# Patient Record
Sex: Male | Born: 1975 | Race: Black or African American | Hispanic: No | Marital: Single | Smoking: Never smoker
Health system: Southern US, Community
[De-identification: ages and names within clinical notes are randomized; demographics above are authoritative.]

---

## 2005-04-14 ENCOUNTER — Ambulatory Visit: Payer: Self-pay | Admitting: Internal Medicine

## 2005-05-05 ENCOUNTER — Encounter: Payer: Self-pay | Admitting: Cardiology

## 2005-05-05 ENCOUNTER — Ambulatory Visit: Payer: Self-pay

## 2005-05-13 ENCOUNTER — Ambulatory Visit: Payer: Self-pay | Admitting: Internal Medicine

## 2008-04-05 ENCOUNTER — Emergency Department (HOSPITAL_COMMUNITY): Admission: EM | Admit: 2008-04-05 | Discharge: 2008-04-06 | Payer: Self-pay | Admitting: Emergency Medicine

## 2012-07-14 ENCOUNTER — Ambulatory Visit (INDEPENDENT_AMBULATORY_CARE_PROVIDER_SITE_OTHER): Payer: BC Managed Care – PPO | Admitting: Emergency Medicine

## 2012-07-14 VITALS — BP 138/83 | HR 71 | Temp 98.3°F | Resp 16 | Ht 69.0 in | Wt 193.0 lb

## 2012-07-14 DIAGNOSIS — S335XXA Sprain of ligaments of lumbar spine, initial encounter: Secondary | ICD-10-CM

## 2012-07-14 MED ORDER — NAPROXEN SODIUM 550 MG PO TABS: 550.0000 mg | ORAL_TABLET | Freq: Two times a day (BID) | ORAL | Status: AC

## 2012-07-14 MED ORDER — CYCLOBENZAPRINE HCL 10 MG PO TABS: 10.0000 mg | ORAL_TABLET | Freq: Three times a day (TID) | ORAL | Status: AC | PRN

## 2012-07-14 NOTE — Patient Instructions (Addendum)

## 2012-07-14 NOTE — Progress Notes (Signed)
[facility] [address] [place] [address] [phone]  Date:  07/14/2012   Name:  Phillip Griffith   DOB:  28-Dec-1975   MRN:  [id number]  PCP:  No primary provider on file.    Chief Complaint: Back Pain   History of Present Illness:  Phillip Griffith is a 37 y.o. very pleasant male patient who presents with the following:  History of sciatic neuritis.  Has low back pain not associated with injury or overuse.  Has pain in right low back.  Non radiating.  No neuro symptoms.  No weakness.  No improvement with over the counter medications or other home remedies. Denies other complaint or health concern today. Works two full time jobs with a significant amount of lifting associated with each.   There are no active problems to display for this patient.   No past medical history on file.  No past surgical history on file.  History  Substance Use Topics  . Smoking status: Never Smoker   . Smokeless tobacco: Never Used  . Alcohol Use: Yes    No family history on file.  Allergies  Allergen Reactions  . Iodine     Medication list has been reviewed and updated.  No current outpatient prescriptions on file prior to visit.   No current facility-administered medications on file prior to visit.    Review of Systems:  As per HPI, otherwise negative.    Physical Examination: Filed Vitals:   07/14/12 1926  BP: 138/83  Pulse: 71  Temp: 98.3 F (36.8 C)  Resp: 16   Filed Vitals:   07/14/12 1926  Height: 5\' 9"  (1.753 m)  Weight: 193 lb (87.544 kg)   Body mass index is 28.49 kg/(m^2). Ideal Body Weight: Weight in (lb) to have BMI = 25: 168.9   GEN: WDWN, NAD, Non-toxic, Alert & Oriented x 3 HEENT: Atraumatic, Normocephalic.  Ears and Nose: No external deformity. EXTR: No clubbing/cyanosis/edema NEURO: Normal gait.  PSYCH: Normally interactive. Conversant. Not depressed or anxious appearing.  Calm demeanor.  BACK:  Tender right paraspinous  muscles.  Neuro grossly intact.  Assessment and Plan: Lumbar strain Anaprox  Flexeril   Signed,  Phillips Odor, MD

## 2019-06-22 ENCOUNTER — Emergency Department (HOSPITAL_COMMUNITY): Payer: 59

## 2019-06-22 ENCOUNTER — Emergency Department (HOSPITAL_COMMUNITY)
Admission: EM | Admit: 2019-06-22 | Discharge: 2019-06-22 | Disposition: A | Discharge status: Home / Self Care | Payer: 59 | Attending: Emergency Medicine | Admitting: Emergency Medicine

## 2019-06-22 ENCOUNTER — Other Ambulatory Visit: Payer: Self-pay

## 2019-06-22 DIAGNOSIS — R350 Frequency of micturition: Secondary | ICD-10-CM | POA: Insufficient documentation

## 2019-06-22 DIAGNOSIS — R569 Unspecified convulsions: Secondary | ICD-10-CM | POA: Diagnosis not present

## 2019-06-22 DIAGNOSIS — F1290 Cannabis use, unspecified, uncomplicated: Secondary | ICD-10-CM | POA: Insufficient documentation

## 2019-06-22 DIAGNOSIS — Z79899 Other long term (current) drug therapy: Secondary | ICD-10-CM | POA: Diagnosis not present

## 2019-06-22 DIAGNOSIS — H539 Unspecified visual disturbance: Secondary | ICD-10-CM | POA: Diagnosis not present

## 2019-06-22 LAB — CBC WITH DIFFERENTIAL/PLATELET
Abs Immature Granulocytes: 0.04 10*3/uL (ref 0.00–0.07)
Basophils Absolute: 0 10*3/uL (ref 0.0–0.1)
Basophils Relative: 0 %
Eosinophils Absolute: 0 10*3/uL (ref 0.0–0.5)
Eosinophils Relative: 0 %
HCT: 43.4 % (ref 39.0–52.0)
Hemoglobin: 14.5 g/dL (ref 13.0–17.0)
Immature Granulocytes: 0 %
Lymphocytes Relative: 13 %
Lymphs Abs: 1.2 10*3/uL (ref 0.7–4.0)
MCH: 28.9 pg (ref 26.0–34.0)
MCHC: 33.4 g/dL (ref 30.0–36.0)
MCV: 86.5 fL (ref 80.0–100.0)
Monocytes Absolute: 0.7 10*3/uL (ref 0.1–1.0)
Monocytes Relative: 7 %
Neutro Abs: 7.1 10*3/uL (ref 1.7–7.7)
Neutrophils Relative %: 80 %
Platelets: UNDETERMINED 10*3/uL (ref 150–400)
RBC: 5.02 MIL/uL (ref 4.22–5.81)
RDW: 12.1 % (ref 11.5–15.5)
WBC: 9.1 10*3/uL (ref 4.0–10.5)
nRBC: 0 % (ref 0.0–0.2)

## 2019-06-22 LAB — BASIC METABOLIC PANEL
Anion gap: 15 (ref 5–15)
BUN: 14 mg/dL (ref 6–20)
CO2: 21 mmol/L — ABNORMAL LOW (ref 22–32)
Calcium: 9 mg/dL (ref 8.9–10.3)
Chloride: 106 mmol/L (ref 98–111)
Creatinine, Ser: 1.55 mg/dL — ABNORMAL HIGH (ref 0.61–1.24)
GFR calc Af Amer: >60 mL/min (ref >60)
GFR calc non Af Amer: 54 mL/min — ABNORMAL LOW (ref >60)
Glucose, Bld: 118 mg/dL — ABNORMAL HIGH (ref 70–99)
Potassium: 3.5 mmol/L (ref 3.5–5.1)
Sodium: 142 mmol/L (ref 135–145)

## 2019-06-22 LAB — CBC
HCT: 42.7 % (ref 39.0–52.0)
Hemoglobin: 13.8 g/dL (ref 13.0–17.0)
MCH: 28.3 pg (ref 26.0–34.0)
MCHC: 32.3 g/dL (ref 30.0–36.0)
MCV: 87.7 fL (ref 80.0–100.0)
Platelets: 187 10*3/uL (ref 150–400)
RBC: 4.87 MIL/uL (ref 4.22–5.81)
RDW: 12.1 % (ref 11.5–15.5)
WBC: 5.3 10*3/uL (ref 4.0–10.5)
nRBC: 0 % (ref 0.0–0.2)

## 2019-06-22 LAB — TROPONIN I (HIGH SENSITIVITY): Troponin I (High Sensitivity): <2 ng/L (ref <18)

## 2019-06-22 LAB — RAPID URINE DRUG SCREEN, HOSP PERFORMED
Amphetamines: NOT DETECTED
Barbiturates: NOT DETECTED
Benzodiazepines: NOT DETECTED
Cocaine: NOT DETECTED
Opiates: NOT DETECTED
Tetrahydrocannabinol: POSITIVE — AB

## 2019-06-22 LAB — CBG MONITORING, ED: Glucose-Capillary: 92 mg/dL (ref 70–99)

## 2019-06-22 MED ORDER — SODIUM CHLORIDE 0.9 % IV BOLUS
1000.0000 mL | Freq: Once | INTRAVENOUS | Status: AC
  Administered 2019-06-22: 1000 mL via INTRAVENOUS

## 2019-06-22 NOTE — ED Notes (Signed)
Pt was discharged from the ED. Pt read and understood discharge paperwork. Pt had vital signs completed. Pt conscious, breathing, and A&Ox4. No distress noted. Pt speaking in complete sentences. Pt ambulated out of the ED with a smooth and steady gait.  

## 2019-06-22 NOTE — ED Triage Notes (Signed)
To ED for eval of possible seizure. Pt was aware of episode. Family describes pt as posturing and fell from the chair. Pt states he was really hot and became dizzy. Doesn't sound like he had postictal state. States he feels tired now. No incontinence. Alert and oriented now. With 18g in LAC. 500 cc NS given PTA by EMS

## 2019-06-22 NOTE — ED Provider Notes (Addendum)
[facility] EMERGENCY DEPARTMENT Provider Note   CSN: [id number] Arrival date & time: 06/22/19  1723     History Chief Complaint  Patient presents with   Seizures    Phillip Griffith is a 44 y.o. male.  HPI 44 year old male with a history of hypertension, not on medication presents to the ER after the outside of this family, son 10, where he suddenly started to feel lightheaded and dizzy, some blurry vision, and he laid himself down in the grass.  He does not remember what happened for the next few minutes, but came to the states that he was slightly confused.  He denies hitting his head, falling, states that this happens to him sometime before but just not to this extent.  He denies seizure-like activity, chest pain, shortness of breath, abdominal pain, nausea, vomiting, diarrhea.  He refers he has episodes like this before when he does not drink enough water.  He does note that he has been urinating more frequently as of late.  Endorses drinking alcohol and smoking marijuana earlier today with his family.  Denies chronic alcohol use.  He does not have a history of seizures, ACS, strokes.  Not on blood thinners.  No past medical history on file.  There are no problems to display for this patient.    No family history on file.  Social History   Tobacco Use   Smoking status: Never Smoker   Smokeless tobacco: Never Used  Substance Use Topics   Alcohol use: Yes   Drug use: No    Home Medications Prior to Admission medications   Medication Sig Start Date End Date Taking? Authorizing Provider  Black Currant Seed Oil 500 MG CAPS Take 1 capsule by mouth daily.   Yes [provider]  VITAMIN D PO Take 1 tablet by mouth daily.   Yes [provider]  cyclobenzaprine (FLEXERIL) 10 MG tablet Take 1 tablet (10 mg total) by mouth 3 (three) times daily as needed for muscle spasms. Patient not taking: Reported on 06/22/2019 07/14/12   Roselee Culver,  MD    Allergies    Iodine  Review of Systems   Review of Systems  Constitutional: Negative for chills, fatigue and fever.  Eyes: Positive for visual disturbance.  Respiratory: Negative for cough, chest tightness and shortness of breath.   Cardiovascular: Negative for chest pain and palpitations.  Gastrointestinal: Negative for abdominal pain, diarrhea, nausea and vomiting.  Genitourinary: Positive for frequency. Negative for dysuria and hematuria.  Musculoskeletal: Negative for arthralgias, back pain and neck pain.  Neurological: Positive for dizziness, syncope, weakness and light-headedness. Negative for seizures, speech difficulty and headaches.  Psychiatric/Behavioral: Positive for confusion.    Physical Exam Updated Vital Signs BP 95/69    Pulse 71    Temp 98 F (36.7 C) (Oral)    Resp 16    SpO2 100%   Physical Exam Constitutional:      General: He is not in acute distress.    Appearance: Normal appearance. He is not ill-appearing, toxic-appearing or diaphoretic.  HENT:     Head: Normocephalic and atraumatic.  Eyes:     Extraocular Movements: Extraocular movements intact.     Pupils: Pupils are equal, round, and reactive to light.  Cardiovascular:     Rate and Rhythm: Normal rate and regular rhythm.     Pulses: Normal pulses.     Heart sounds: Normal heart sounds.  Pulmonary:     Effort: Pulmonary effort is normal.  Breath sounds: Normal breath sounds.  Abdominal:     General: Abdomen is flat.     Palpations: Abdomen is soft.  Musculoskeletal:        General: No swelling or tenderness.     Cervical back: Normal range of motion. No rigidity or tenderness.  Skin:    General: Skin is warm and dry.  Neurological:     General: No focal deficit present.     Mental Status: He is alert and oriented to person, place, and time.     Cranial Nerves: No cranial nerve deficit.     Sensory: No sensory deficit.     Motor: No weakness.     Coordination: Coordination  normal.  Psychiatric:        Mood and Affect: Mood normal.        Behavior: Behavior normal.     ED Results / Procedures / Treatments   Labs (all labs ordered are listed, but only abnormal results are displayed) Labs Reviewed  BASIC METABOLIC PANEL - Abnormal; Notable for the following components:      Result Value   CO2 21 (*)    Glucose, Bld 118 (*)    Creatinine, Ser 1.55 (*)    GFR calc non Af Amer 54 (*)    All other components within normal limits  CBC  CBC WITH DIFFERENTIAL/PLATELET  RAPID URINE DRUG SCREEN, HOSP PERFORMED  ETHANOL  CBG MONITORING, ED  TROPONIN I (HIGH SENSITIVITY)  TROPONIN I (HIGH SENSITIVITY)    EKG EKG Interpretation  Date/Time:  Thursday June 22 2019 17:22:50 EDT Ventricular Rate:  70 PR Interval:  174 QRS Duration: 92 QT Interval:  408 QTC Calculation: 440 R Axis:   78 Text Interpretation: Normal sinus rhythm Minimal voltage criteria for LVH, may be normal variant ( Sokolow-Lyon ) Nonspecific ST and T wave abnormality Abnormal ECG No STEMI Confirmed by Alona Bene [phone]) on 06/22/2019 8:09:04 PM   Radiology CT Head Wo Contrast  Result Date: 06/22/2019 CLINICAL DATA:  44 year old male with seizures. Nontraumatic. EXAM: CT HEAD WITHOUT CONTRAST TECHNIQUE: Contiguous axial images were obtained from the base of the skull through the vertex without intravenous contrast. COMPARISON:  None. FINDINGS: Brain: The ventricles and sulci appropriate size for patient's age. The gray-white matter discrimination is preserved. There is no acute intracranial hemorrhage. No mass effect or midline shift. No extra-axial fluid collection. A 2.5 x 1.0 cm calcification along the left falx versus less likely a calcified meningioma. Vascular: No hyperdense vessel or unexpected calcification. Skull: Normal. Negative for fracture or focal lesion. Sinuses/Orbits: Minimal mucoperiosteal thickening of paranasal sinuses. No air-fluid level. The mastoid air cells are clear.  Cerumen noted in the external auditory canal bilaterally. Other: None IMPRESSION: No acute intracranial pathology. Electronically Signed   By: Elgie Collard M.D.   On: 06/22/2019 21:21   DG Chest Portable 1 View  Result Date: 06/22/2019 CLINICAL DATA:  Status post seizure. EXAM: PORTABLE CHEST 1 VIEW COMPARISON:  None. FINDINGS: The heart size and mediastinal contours are within normal limits. Both lungs are clear. The visualized skeletal structures are unremarkable. IMPRESSION: No active disease. Electronically Signed   By: Aram Candela M.D.   On: 06/22/2019 20:59    Procedures Procedures (including critical care time)  Medications Ordered in ED Medications  sodium chloride 0.9 % bolus 1,000 mL (0 mLs Intravenous Stopped 06/22/19 1955)    ED Course  I have reviewed the triage vital signs and the nursing notes.  Pertinent labs &  imaging results that were available during my care of the patient were reviewed by me and considered in my medical decision making (see chart for details).    MDM Rules/Calculators/A&P                     44 year old male without a significant medical history presents for possible new onset seizures. On presentation to the ER, patient is alert and oriented, no acute distress, reports that he is tired but currently asymptomatic.  Vitals nonconcerning.  Afebrile, no neck stiffness.  Physical exam with no pertinent positive findings.  No signs of tongue biting, or urination on self.  He is alert and oriented x4.  See HPI for further descriptions of his symptoms.  Patient reports he does not think he has a seizure, but is unaware of what exactly happened.  EKG with minimal voltage criteria for LVH and none specific ST and T wave changes.  Given this, will order delta troponin.   Upon speaking to the patient's family member over the phone, they described that he was sitting on a chair, started to say some weird words, and then laid himself down, clenched his fists  with some vibration of his upper and lower extremities with his eyes rolled behind his head.  She states this episode lasted approximate minute or 2, after which he came to slowly, but was confused.  Given this history, concern for new onset seizures is higher.  Will order CT of the head, UDS and alcohol level.  If negative, will send ambulatory referral to neurology for further follow-up.  Patient given 1 L fluid bolus in the ER, on top of the 500 cc bolus given by EMS.  Patient denies any nausea, vomiting.  Reports improvement of symptoms since presentation to the ER.  CT head negative.  Initial troponin less than 2.  Suspicion for ACS low.  Chest x-ray negative.  UDS and ethanol pending, but will not likely change ED course.   Unclear etiology of seizures likely seizure.  No repeat seizures in the ER, patient is alert and oriented, feeling well.  Ambulatory referral to neurology with close follow-up. Stressed to patient that he absolutely cannot drive in the state of [place] for 6 months until he is cleared by neurologist.  Return precautions given which included repeat seizures, increasing somnolence, nausea, vomiting.  Patient was seen and evaluated by Dr. Jacqulyn Bath is agreeable to the above plan.  Final Clinical Impression(s) / ED Diagnoses Final diagnoses:  Seizure-like activity (HCC)    Rx / DC Orders ED Discharge Orders         Ordered    Ambulatory referral to Neurology    Comments: An appointment is requested in approximately: 1 week with new onset seizure   06/22/19 2103           Mare Ferrari, PA-C 06/22/19 2307    Mare Ferrari, PA-C 06/22/19 2313    Maia Plan, MD 06/23/19 [phone]

## 2019-06-22 NOTE — Discharge Instructions (Addendum)
You have been seen in the emergency department today for a likely seizure.  Your workup today including labs are within normal limits.  Please follow up with your doctor as soon as possible regarding today's emergency department visit and your likely seizure.  You will also need to follow up with a neurologist as soon as possible, please call for appointment.  If you have been prescribed a medication for your seizures, please take this medication as prescribed.  As we have discussed it is very important that you DO NOT drive until you have been seen and cleared by your neurologist.  Please drink plenty of fluids, get plenty of sleep and avoid any alcohol or drug use.  Follow-up with your primary care provider or if you cannot afford a family doctor please visit [facility] to establish with them.  Return to the emergency department if you have any further seizures, develop any weakness/numbness of any arm/leg, confusion, slurred speech, or sudden/severe headache.

## 2019-07-05 ENCOUNTER — Other Ambulatory Visit: Payer: Self-pay

## 2019-07-05 ENCOUNTER — Ambulatory Visit: Payer: 59 | Admitting: Diagnostic Neuroimaging

## 2019-07-05 ENCOUNTER — Encounter: Payer: Self-pay | Admitting: Diagnostic Neuroimaging

## 2019-07-05 VITALS — BP 155/96 | HR 83 | Temp 98.2°F | Ht 69.0 in | Wt 187.8 lb

## 2019-07-05 DIAGNOSIS — G43109 Migraine with aura, not intractable, without status migrainosus: Secondary | ICD-10-CM

## 2019-07-05 DIAGNOSIS — R569 Unspecified convulsions: Secondary | ICD-10-CM

## 2019-07-05 NOTE — Progress Notes (Signed)
[facility]  PATIENT: Phillip Griffith DOB: 27-Jun-1975  REFERRING CLINICIAN: Long, Arlyss Repress, MD HISTORY FROM: patient  REASON FOR VISIT: new consult    HISTORICAL  CHIEF COMPLAINT:  Chief Complaint  Patient presents with  . Seizures    Rm 7 New Pt, mother- Rachael Darby "possible seizure on 06/22/19"     HISTORY OF PRESENT ILLNESS:   44 year old male 44 year old male here for evaluation of seizure or syncope.  06/22/2019 patient was at home with family, when he felt overheated and nauseated.  He sat down to rest.  The next thing he knows he woke up with people around him.  Apparently he had passed out, stiffened up, eyes rolled back.  Episode lasted for about 1 to 2 minutes.  Afterwards he was snoring for a while and then woke up.  He continued to feel drained of energy and tired.  EMS called and patient arrived to the emergency room.  CT scan and lab testing obtained.  Patient was diagnosed with possible new onset seizure versus syncope.  Since that time patient continues to feel tired and drained of energy.  He has been having more headaches in the last 3 months with migraine features, photophobia, nausea, occurring on a daily basis.  He averages about 5 hours of sleep per night has been under more stress lately.  No prior similar events.   REVIEW OF SYSTEMS: Full 14 system review of systems performed and negative with exception of: As per HPI.  ALLERGIES: Allergies  Allergen Reactions  . Iodine Rash    HOME MEDICATIONS: Outpatient Medications Prior to Visit  Medication Sig Dispense Refill  . Black Currant Seed Oil 500 MG CAPS Take 1 capsule by mouth daily.    Marland Kitchen VITAMIN D PO Take 1 tablet by mouth daily.    . cyclobenzaprine (FLEXERIL) 10 MG tablet Take 1 tablet (10 mg total) by mouth 3 (three) times daily as needed for muscle spasms. (Patient not taking: Reported on 06/22/2019) 30 tablet 0   No facility-administered medications prior to visit.    PAST  MEDICAL HISTORY: No past medical history on file.  PAST SURGICAL HISTORY: History reviewed. No pertinent surgical history.  FAMILY HISTORY: Family History  Problem Relation Age of Onset  . Hypertension Father     SOCIAL HISTORY: Social History   Socioeconomic History  . Marital status: Single    Spouse name: Not on file  . Number of children: Not on file  . Years of education: Not on file  . Highest education level: GED or equivalent  Occupational History  . Not on file  Tobacco Use  . Smoking status: Never Smoker  . Smokeless tobacco: Never Used  Substance and Sexual Activity  . Alcohol use: Yes    Comment: 07/05/19 maybe 1-2 x month  . Drug use: No    Comment: 07/05/19 denies  . Sexual activity: Yes  Other Topics Concern  . Not on file  Social History Narrative  . Not on file   Social Determinants of Health   Financial Resource Strain:   . Difficulty of Paying Living Expenses:   Food Insecurity:   . Worried About Programme researcher, broadcasting/film/video in the Last Year:   . Barista in the Last Year:   Transportation Needs:   . Freight forwarder (Medical):   Marland Kitchen Lack of Transportation (Non-Medical):   Physical Activity:   . Days of Exercise per Week:   . Minutes of Exercise per Session:  Stress:   . Feeling of Stress :   Social Connections:   . Frequency of Communication with Friends and Family:   . Frequency of Social Gatherings with Friends and Family:   . Attends Religious Services:   . Active Member of Clubs or Organizations:   . Attends Archivist Meetings:   Marland Kitchen Marital Status:   Intimate Partner Violence:   . Fear of Current or Ex-Partner:   . Emotionally Abused:   Marland Kitchen Physically Abused:   . Sexually Abused:      PHYSICAL EXAM  GENERAL EXAM/CONSTITUTIONAL: Vitals:  Vitals:   07/05/19 1118 07/05/19 1122  BP: (!) 156/90 (!) 155/96  Pulse: 83 83  Temp: 98.2 F (36.8 C)   Weight: 187 lb 12.8 oz (85.2 kg)   Height: 5\' 9"  (1.753 m)       Body mass index is 27.73 kg/m. Wt Readings from Last 3 Encounters:  07/05/19 187 lb 12.8 oz (85.2 kg)  07/14/12 193 lb (87.5 kg)     Patient is in no distress; well developed, nourished and groomed; neck is supple  CARDIOVASCULAR:  Examination of carotid arteries is normal; no carotid bruits  Regular rate and rhythm, no murmurs  Examination of peripheral vascular system by observation and palpation is normal  EYES:  Ophthalmoscopic exam of optic discs and posterior segments is normal; no papilledema or hemorrhages  No exam data present  MUSCULOSKELETAL:  Gait, strength, tone, movements noted in Neurologic exam below  NEUROLOGIC: MENTAL STATUS:  No flowsheet data found.  awake, alert, oriented to person, place and time  recent and remote memory intact  normal attention and concentration  language fluent, comprehension intact, naming intact  fund of knowledge appropriate  CRANIAL NERVE:   2nd - no papilledema on fundoscopic exam  2nd, 3rd, 4th, 6th - pupils equal and reactive to light, visual fields full to confrontation, extraocular muscles intact, no nystagmus  5th - facial sensation symmetric  7th - facial strength symmetric  8th - hearing intact  9th - palate elevates symmetrically, uvula midline  11th - shoulder shrug symmetric  12th - tongue protrusion midline  MOTOR:   normal bulk and tone, full strength in the BUE, BLE  SENSORY:   normal and symmetric to light touch, pinprick, temperature, vibration  COORDINATION:   finger-nose-finger, fine finger movements normal  REFLEXES:   deep tendon reflexes present and symmetric  GAIT/STATION:   narrow based gait     DIAGNOSTIC DATA (LABS, IMAGING, TESTING) - I reviewed patient records, labs, notes, testing and imaging myself where available.  Lab Results  Component Value Date   WBC 9.1 06/22/2019   HGB 14.5 06/22/2019   HCT 43.4 06/22/2019   MCV 86.5 06/22/2019   PLT  PLATELET CLUMPS NOTED ON SMEAR, UNABLE TO ESTIMATE 06/22/2019      Component Value Date/Time   NA 142 06/22/2019 1745   K 3.5 06/22/2019 1745   CL 106 06/22/2019 1745   CO2 21 (L) 06/22/2019 1745   GLUCOSE 118 (H) 06/22/2019 1745   BUN 14 06/22/2019 1745   CREATININE 1.55 (H) 06/22/2019 1745   CALCIUM 9.0 06/22/2019 1745   GFRNONAA 54 (L) 06/22/2019 1745   GFRAA >60 06/22/2019 1745   No results found for: CHOL, HDL, LDLCALC, LDLDIRECT, TRIG, CHOLHDL No results found for: HGBA1C No results found for: VITAMINB12 No results found for: TSH   06/22/19 CT head [I reviewed images myself and agree with interpretation. -VRP]  - negative  ASSESSMENT AND PLAN  44 y.o. year old male here with:  Dx:  1. New onset seizure (HCC)   2. Migraine with aura and without status migrainosus, not intractable       PLAN:  NEW ONSET SEIZURE vs SYNCOPE  - check MRI brain, EEG   - establish with PCP (follow up BP and kidney function and syncope workup)  - According to [place] law, you can not drive unless you are seizure / syncope free for at least 6 months and under physician's care.   - Please maintain precautions. Do not participate in activities where a loss of awareness could harm you or someone else. No swimming alone, no tub bathing, no hot tubs, no driving, no operating motorized vehicles (cars, ATVs, motocycles, etc), lawnmowers, power tools or firearms. No standing at heights, such as rooftops, ladders or stairs. Avoid hot objects such as stoves, heaters, open fires. Wear a helmet when riding a bicycle, scooter, skateboard, etc. and avoid areas of traffic. Set your water heater to 120 degrees or less.   MIGRAINE PREVENTION  LIFESTYLE CHANGES -Stop or avoid smoking -Decrease or avoid caffeine / alcohol -Eat and sleep on a regular schedule -Exercise several times per week - consider topiramate 50mg  at bedtime; after 1-2 weeks increase to 50mg  twice a day; drink plenty of  water  MIGRAINE RESCUE  - ibuprofen, tylenol as needed - consider rizatriptan (Maxalt) 10mg  as needed for breakthrough headache; may repeat x 1 after 2 hours; max 2 tabs per day or 8 per month  Orders Placed This Encounter  Procedures  . MR BRAIN W WO CONTRAST  . EEG adult   Return in about 6 months (around 01/04/2020).    , MD 07/05/2019, 11:32 AM Certified in Neurology, Neurophysiology and Neuroimaging  [facility] [address], [address] [place], [place] [address] [phone]

## 2019-07-05 NOTE — Patient Instructions (Signed)
NEW ONSET SEIZURE vs SYNCOPE  - check MRI brain, EEG   - establish with PCP (follow up BP and kidney function and syncope workup)  - According to [place] law, you can not drive unless you are seizure / syncope free for at least 6 months and under physician's care.   - Please maintain precautions. Do not participate in activities where a loss of awareness could harm you or someone else. No swimming alone, no tub bathing, no hot tubs, no driving, no operating motorized vehicles (cars, ATVs, motocycles, etc), lawnmowers, power tools or firearms. No standing at heights, such as rooftops, ladders or stairs. Avoid hot objects such as stoves, heaters, open fires. Wear a helmet when riding a bicycle, scooter, skateboard, etc. and avoid areas of traffic. Set your water heater to 120 degrees or less.   MIGRAINE PREVENTION  LIFESTYLE CHANGES -Stop or avoid smoking -Decrease or avoid caffeine / alcohol -Eat and sleep on a regular schedule -Exercise several times per week - consider topiramate 50mg  at bedtime; after 1-2 weeks increase to 50mg  twice a day; drink plenty of water  MIGRAINE RESCUE  - ibuprofen, tylenol as needed - consider rizatriptan (Maxalt) 10mg  as needed for breakthrough headache; may repeat x 1 after 2 hours; max 2 tabs per day or 8 per month

## 2019-07-10 ENCOUNTER — Telehealth: Payer: Self-pay | Admitting: Diagnostic Neuroimaging

## 2019-07-10 NOTE — Telephone Encounter (Signed)
no to the covid questions MR Brain w/wo contrast Dr. Marjory Lies [facility] Auth: [id number] (exp. 07/07/19 to 08/21/19). Patient is scheduled at [facility] for 08/09/19.

## 2019-08-03 ENCOUNTER — Other Ambulatory Visit: Payer: Self-pay

## 2019-08-03 ENCOUNTER — Ambulatory Visit: Payer: 59 | Admitting: Diagnostic Neuroimaging

## 2019-08-03 DIAGNOSIS — R569 Unspecified convulsions: Secondary | ICD-10-CM | POA: Diagnosis not present

## 2019-08-09 ENCOUNTER — Other Ambulatory Visit: Payer: 59

## 2019-08-11 MED ORDER — TOPIRAMATE 50 MG PO TABS: 50.0000 mg | ORAL_TABLET | Freq: Two times a day (BID) | ORAL | 12 refills | Status: AC

## 2019-08-11 NOTE — Procedures (Signed)
   [facility]  EEG (ELECTROENCEPHALOGRAM) REPORT   STUDY DATE: 08/03/19 PATIENT NAME: Phillip Griffith DOB: 1975-06-09 MRN: [id number]  ORDERING CLINICIAN: Joycelyn Schmid, MD   TECHNOLOGIST: Ardis Hughs  TECHNIQUE: Electroencephalogram was recorded utilizing standard 10-20 system of lead placement and reformatted into average and bipolar montages.  RECORDING TIME: 31 minutes  ACTIVATION: hyperventilation and photic stimulation  CLINICAL INFORMATION: 44 year old male with new onset seizure.  FINDINGS: Posterior dominant background rhythms, which attenuate with eye opening, ranging 10-11 hertz and 15-20 microvolts.   Intermittent bifrontal spike and spike and wave complexes noted.  Intermittent right frontal and temporal sharp waves noted at Fp2, F8 and T4.  Patient recorded in the awake and drowsy state. EKG channel shows regular rhythm of 70-80 beats per minute.   IMPRESSION:   Abnormal EEG demonstrating: - Intermittent bifrontal spike and spike and wave complexes noted.  Intermittent right frontal and temporal sharp waves noted at Fp2, F8 and T4.  These are potentially epileptiform discharges which can be associated with increased tendency for seizures.    INTERPRETING PHYSICIAN:  Suanne Marker, MD Certified in Neurology, Neurophysiology and Neuroimaging  [facility] [address] [place], [place] [address] [phone]

## 2019-08-11 NOTE — Progress Notes (Signed)
  I reviewed EEG results. Some epileptiform discharges noted. Will start TPX for seizure prevention and migraine prevention.    Meds ordered this encounter  Medications  . topiramate (TOPAMAX) 50 MG tablet    Sig: Take 1 tablet (50 mg total) by mouth 2 (two) times daily.    Dispense:  60 tablet    Refill:  12    Suanne Marker, MD 08/11/2019, 1:08 PM Certified in Neurology, Neurophysiology and Neuroimaging  [facility] [address], [address] [place], [place] [address] [phone]

## 2019-08-16 ENCOUNTER — Ambulatory Visit: Payer: 59

## 2019-08-16 ENCOUNTER — Telehealth: Payer: Self-pay | Admitting: *Deleted

## 2019-08-16 NOTE — Telephone Encounter (Signed)
Patient came into office today for MRI and stated he forgot to bring his card to pay. He asked to speak to RN. I spoke with him and he verbalized a lot going on in his life, being forgetful.  He asked to delay getting MRI for about two weeks while he takes care of what's going on. He apologized , and I advised we are happy to reschedule his MRI when he is ready. He stated he has started taking Topiramate as prescribed for seziures, and his headaches are more manageable now, less frequent. I advised will let Dr Marjory Lies know. Patient verbalized understanding, appreciation.

## 2019-08-16 NOTE — Telephone Encounter (Signed)
Noted. -VRP 

## 2019-09-04 NOTE — Telephone Encounter (Signed)
Updated [employer] Auth: [id number] (exp. 09/04/19 to 10/19/19)  Patient is scheduled at [facility] for 09/13/19

## 2019-09-13 ENCOUNTER — Ambulatory Visit: Payer: 59

## 2019-09-13 DIAGNOSIS — R569 Unspecified convulsions: Secondary | ICD-10-CM | POA: Diagnosis not present

## 2019-09-13 MED ORDER — GADOBENATE DIMEGLUMINE 529 MG/ML IV SOLN
15.0000 mL | Freq: Once | INTRAVENOUS | Status: AC | PRN
  Administered 2019-09-13: 15 mL via INTRAVENOUS

## 2019-09-19 ENCOUNTER — Telehealth: Payer: Self-pay | Admitting: *Deleted

## 2019-09-19 NOTE — Telephone Encounter (Signed)
Spoke with patient and informed his MRI brain showed uremarkable imaging results. Advised he continue taking Topamax as prescribed. Patient verbalized understanding, appreciation.

## 2020-01-08 ENCOUNTER — Encounter: Payer: Self-pay | Admitting: Diagnostic Neuroimaging

## 2020-01-08 ENCOUNTER — Telehealth: Payer: Self-pay | Admitting: *Deleted

## 2020-01-08 ENCOUNTER — Ambulatory Visit: Payer: 59 | Admitting: Diagnostic Neuroimaging

## 2020-01-08 NOTE — Telephone Encounter (Signed)
Patient was no show for follow up today. 

## 2020-09-26 IMAGING — DX DG CHEST 1V PORT
1 series · 1 of 1 positions shown · non-contrast
Comparison: None.

CLINICAL DATA: Status post seizure.

EXAM:
PORTABLE CHEST 1 VIEW

[chest ap]
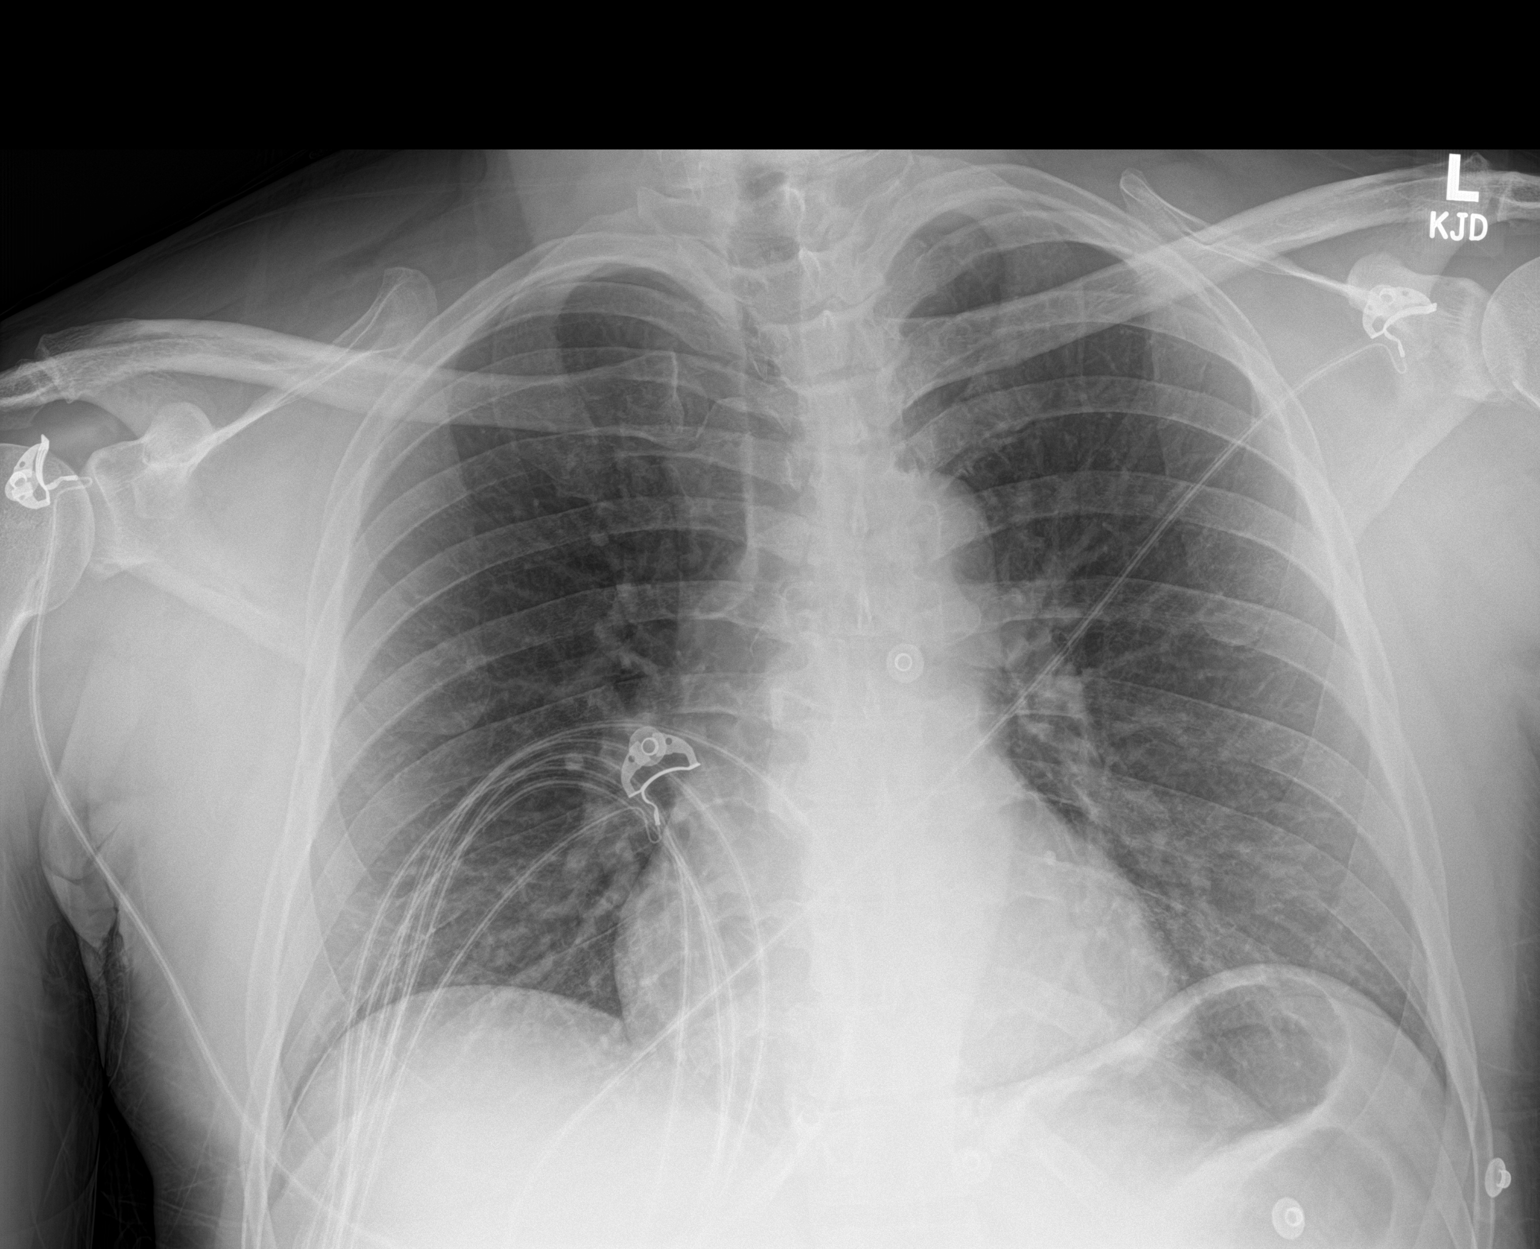

[1 of 1 positions shown; findings below may reference images not displayed]

FINDINGS: The heart size and mediastinal contours are within normal limits.
Both lungs are clear. The visualized skeletal structures are
unremarkable.
IMPRESSION: No active disease.
